# Patient Record
Sex: Male | Born: 1960 | Race: White | Hispanic: No | Marital: Married | Smoking: Never smoker
Health system: Southern US, Community
[De-identification: ages and names within clinical notes are randomized; demographics above are authoritative.]

## PROBLEM LIST (undated history)

## (undated) DIAGNOSIS — G8929 Other chronic pain: Secondary | ICD-10-CM

## (undated) DIAGNOSIS — I10 Essential (primary) hypertension: Secondary | ICD-10-CM

## (undated) DIAGNOSIS — E785 Hyperlipidemia, unspecified: Secondary | ICD-10-CM

## (undated) DIAGNOSIS — M869 Osteomyelitis, unspecified: Secondary | ICD-10-CM

## (undated) DIAGNOSIS — E039 Hypothyroidism, unspecified: Secondary | ICD-10-CM

## (undated) DIAGNOSIS — M25519 Pain in unspecified shoulder: Secondary | ICD-10-CM

## (undated) HISTORY — DX: Hyperlipidemia, unspecified: E78.5

## (undated) HISTORY — DX: Hypothyroidism, unspecified: E03.9

## (undated) HISTORY — DX: Osteomyelitis, unspecified: M86.9

## (undated) HISTORY — DX: Other chronic pain: G89.29

## (undated) HISTORY — DX: Essential (primary) hypertension: I10

## (undated) HISTORY — DX: Pain in unspecified shoulder: M25.519

---
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
## 1989-01-29 HISTORY — PX: COLOSTOMY TAKEDOWN: SHX5783

## 1989-01-29 HISTORY — PX: COLON RESECTION: SHX5231

## 1989-01-29 HISTORY — PX: COLOSTOMY: SHX63

## 1992-01-30 LAB — HM COLONOSCOPY

## 2010-08-29 ENCOUNTER — Encounter: Payer: Self-pay | Admitting: Family Medicine

## 2010-08-29 DIAGNOSIS — E039 Hypothyroidism, unspecified: Secondary | ICD-10-CM | POA: Insufficient documentation

## 2010-08-29 DIAGNOSIS — E785 Hyperlipidemia, unspecified: Secondary | ICD-10-CM | POA: Insufficient documentation

## 2010-08-29 DIAGNOSIS — I10 Essential (primary) hypertension: Secondary | ICD-10-CM | POA: Insufficient documentation

## 2012-04-17 ENCOUNTER — Telehealth: Payer: Self-pay | Admitting: Family Medicine

## 2012-04-17 NOTE — Telephone Encounter (Signed)
Med has been discontinued

## 2012-04-17 NOTE — Telephone Encounter (Signed)
Pharmacy made aware that medication has been d/c'd

## 2012-04-17 NOTE — Telephone Encounter (Signed)
Need approval for controlled medication. 

## 2012-04-21 ENCOUNTER — Telehealth: Payer: Self-pay | Admitting: Family Medicine

## 2012-04-21 NOTE — Telephone Encounter (Signed)
Med was dc'd at last ov/ no refill

## 2012-04-23 ENCOUNTER — Telehealth: Payer: Self-pay | Admitting: Family Medicine

## 2012-04-23 NOTE — Telephone Encounter (Signed)
Fax back paper for denial of medication was dc'd in 12/13 ov

## 2012-04-28 ENCOUNTER — Telehealth: Payer: Self-pay | Admitting: Family Medicine

## 2012-04-28 MED ORDER — CLONAZEPAM 0.5 MG PO TABS: 0.5000 mg | ORAL_TABLET | Freq: Every evening | ORAL | Status: DC | PRN

## 2012-04-28 NOTE — Telephone Encounter (Signed)
Ok to call out 30 with 2 refills.

## 2012-04-28 NOTE — Telephone Encounter (Signed)
rx called out

## 2012-04-28 NOTE — Telephone Encounter (Signed)
Ok to refill 

## 2012-05-04 ENCOUNTER — Other Ambulatory Visit: Payer: Self-pay | Admitting: Family Medicine

## 2012-05-06 ENCOUNTER — Other Ambulatory Visit: Payer: Self-pay | Admitting: Family Medicine

## 2012-06-09 ENCOUNTER — Telehealth: Payer: Self-pay | Admitting: Family Medicine

## 2012-06-09 MED ORDER — SIMVASTATIN 40 MG PO TABS: 40.0000 mg | ORAL_TABLET | Freq: Every day | ORAL | Status: DC

## 2012-06-09 NOTE — Telephone Encounter (Signed)
rx refilled.

## 2012-07-06 ENCOUNTER — Other Ambulatory Visit: Payer: Self-pay | Admitting: Family Medicine

## 2012-07-27 ENCOUNTER — Other Ambulatory Visit: Payer: Self-pay | Admitting: Family Medicine

## 2012-07-29 ENCOUNTER — Other Ambulatory Visit: Payer: Self-pay | Admitting: Family Medicine

## 2012-07-29 NOTE — Telephone Encounter (Signed)
?   OK to Refill  

## 2012-07-29 NOTE — Telephone Encounter (Signed)
Med refilled.

## 2012-07-29 NOTE — Telephone Encounter (Signed)
He has had no Ov since on Epic. Last refill was in March. Please pull paper chart and review to see if seems appropriate for refill and see if due for OV.

## 2012-07-31 NOTE — Telephone Encounter (Signed)
Med refijlled

## 2012-07-31 NOTE — Telephone Encounter (Signed)
LOV was bp check in 08/28/11. His last refill was March b/c Dr. Tanya Nones give him 2 additional refills.. Can you refill x 1 month and will send out letter to schedule ov.

## 2012-07-31 NOTE — Telephone Encounter (Signed)
i had already looked at his chart before I saw sandys note. He had a OV 04/11/12.  He can have # 60 / 2 additional refills.

## 2012-08-28 ENCOUNTER — Telehealth: Payer: Self-pay | Admitting: Family Medicine

## 2012-08-28 MED ORDER — ALLOPURINOL 100 MG PO TABS: 200.0000 mg | ORAL_TABLET | Freq: Every day | ORAL | Status: DC

## 2012-08-28 MED ORDER — PREDNISONE 20 MG PO TABS: ORAL_TABLET | ORAL | Status: DC

## 2012-08-28 NOTE — Telephone Encounter (Signed)
You don't want to start allopurinol in a gout flare because it will make it worse.  Call out prednisone taper pack.  Resume allopurinol 2 weeks after flare has subsided.

## 2012-08-28 NOTE — Telephone Encounter (Signed)
..  Patient aware and meds sent to pharm

## 2012-09-07 ENCOUNTER — Other Ambulatory Visit: Payer: Self-pay | Admitting: Family Medicine

## 2012-09-10 ENCOUNTER — Other Ambulatory Visit: Payer: Self-pay | Admitting: Family Medicine

## 2012-10-31 ENCOUNTER — Other Ambulatory Visit: Payer: Self-pay | Admitting: Family Medicine

## 2012-11-04 ENCOUNTER — Other Ambulatory Visit: Payer: Self-pay | Admitting: Family Medicine

## 2012-11-05 ENCOUNTER — Other Ambulatory Visit: Payer: Self-pay | Admitting: Family Medicine

## 2012-11-09 ENCOUNTER — Other Ambulatory Visit: Payer: Self-pay | Admitting: Family Medicine

## 2012-11-10 NOTE — Telephone Encounter (Signed)
ok 

## 2012-11-10 NOTE — Telephone Encounter (Signed)
?   OK to Refill  

## 2013-01-17 ENCOUNTER — Other Ambulatory Visit: Payer: Self-pay | Admitting: Family Medicine

## 2013-01-19 ENCOUNTER — Other Ambulatory Visit: Payer: Self-pay | Admitting: Family Medicine

## 2013-01-29 HISTORY — PX: SOFT TISSUE BIOPSY: SHX1053

## 2013-02-05 ENCOUNTER — Encounter: Payer: Self-pay | Admitting: Physician Assistant

## 2013-02-05 ENCOUNTER — Ambulatory Visit (INDEPENDENT_AMBULATORY_CARE_PROVIDER_SITE_OTHER): Admitting: Physician Assistant

## 2013-02-05 VITALS — BP 162/108 | HR 68 | Temp 98.1°F | Resp 20 | Ht 71.0 in | Wt 240.0 lb

## 2013-02-05 DIAGNOSIS — M5380 Other specified dorsopathies, site unspecified: Secondary | ICD-10-CM

## 2013-02-05 DIAGNOSIS — M62830 Muscle spasm of back: Secondary | ICD-10-CM

## 2013-02-05 MED ORDER — HYDROCODONE-ACETAMINOPHEN 5-325 MG PO TABS: 1.0000 | ORAL_TABLET | Freq: Four times a day (QID) | ORAL | Status: DC | PRN

## 2013-02-05 MED ORDER — METAXALONE 800 MG PO TABS: 800.0000 mg | ORAL_TABLET | Freq: Three times a day (TID) | ORAL | Status: DC

## 2013-02-05 NOTE — Progress Notes (Signed)
Patient ID: Daniel BeardsRobert Houston MRN: [id number], DOB: 11/23/1960, 53 y.o. Date of Encounter: 02/05/2013, 4:33 PM    Chief Complaint:  Chief Complaint  Patient presents with  . back pain    x 2 days  no known injury     HPI: 53 y.o. year old white male says " this isn't the first time this is has happened, but definitely the worst." Says he has had some minor muscle spasms in his back in the past but they always resolved without having to seek any medical evaluation. Says that yesterday he went to get a haircut, then came home. Says that he just moved the wrong way and suddenly this pain and spasm was there. Says he was doing no lifting or bending or any other exertion at that time. Points to the midline at the level of his pant waistline as the area of pain and spasm. Says the pain wears off when he lays down. Says that when he has to stand from a sitting position is the worst. As well, certain movements seem to make it spasm. He has had no pain in the buttock. No pain down either leg. No weakness numbness or tingling in either leg or foot. No incontinence of bowel or bladder.     Home Meds: See attached medication section for any medications that were entered at today's visit. The computer does not put those onto this list.The following list is a list of meds entered prior to today's visit.   Current Outpatient Prescriptions on File Prior to Visit  Medication Sig Dispense Refill  . allopurinol (ZYLOPRIM) 100 MG tablet TAKE 2 TABLETS (200 MG TOTAL) BY MOUTH DAILY.  60 tablet  1  . clonazePAM (KLONOPIN) 0.5 MG tablet TAKE 1 TABLET BY MOUTH AT BEDTIME  30 tablet  2  . fenofibrate 54 MG tablet TAKE 1 TABLET EVERY DAY  30 tablet  1  . levothyroxine (SYNTHROID, LEVOTHROID) 175 MCG tablet TAKE 1 TABLET BY MOUTH EVERY DAY  30 tablet  5  . losartan-hydrochlorothiazide (HYZAAR) 100-12.5 MG per tablet TAKE 1 TABLET EVERY DAY FOR BLOOD PRESSURE **NEED TO MAKE OFFICE APPT**  30 tablet  1  . simvastatin  (ZOCOR) 40 MG tablet TAKE 1 TABLET (40 MG TOTAL) BY MOUTH AT BEDTIME.  30 tablet  3  . ALPRAZolam (XANAX) 0.5 MG tablet Take 0.5 mg by mouth 3 (three) times daily as needed.        . diazepam (VALIUM) 5 MG tablet Take 5 mg by mouth at bedtime as needed.        . indomethacin (INDOCIN) 25 MG capsule Take 25 mg by mouth 2 (two) times daily with a meal.         No current facility-administered medications on file prior to visit.    Allergies:  Allergies  Allergen Reactions  . Fish Allergy   . Penicillins Rash      Review of Systems: See HPI for pertinent ROS. All other ROS negative.    Physical Exam: Blood pressure 152/88, pulse 68, temperature 98.1 F (36.7 C), temperature source Oral, resp. rate 20, height 5\' 11"  (1.803 m), weight 240 lb (108.863 kg)., Body mass index is 33.49 kg/(m^2). General:  WNWD WM. ANAD when sitting still. Has severe spasm/ pain when stands after sitting in chair. Did not have him get up on exam table, as he develops severe spasm/pain with movements. Neck: Supple. No thyromegaly. No lymphadenopathy. Lungs: Clear bilaterally to auscultation without wheezes, rales, or  rhonchi. Breathing is unlabored. Heart: Regular rhythm. No murmurs, rubs, or gallops. Msk:  Strength and tone normal for age.See Note above under "GENERAL" exam. Neuro: Alert and oriented X 3. Moves all extremities spontaneously.  CNII-XII grossly in tact.  Psych:  Responds to questions appropriately with a normal affect.     ASSESSMENT AND PLAN:  53 y.o. year old male with  1. Muscle spasm of back He works as a IT sales professional. He was supposed to be at work today but had to call in. I am giving him a note to cover him for being out of work today. He says that he is already scheduled to be off for the next 4 days.  I cautioned him that these medications can cause drowsiness and "loopiness" and not to take these medications prior to driving or operating any type of vehicle/machinery. I discussed using  heat to help relax the muscles. He is to use the Skelaxin regularly 3-4 times a day until the spasm decreases. Can use the Norco as needed. Follow up if he has no improvement in 48 hours. Followup if continues to have any symptoms in one week. Discussed indications to return for x-ray and further evaluation. - metaxalone (SKELAXIN) 800 MG tablet; Take 1 tablet (800 mg total) by mouth 3 (three) times daily.  Dispense: 30 tablet; Refill: 1 - HYDROcodone-acetaminophen (NORCO/VICODIN) 5-325 MG per tablet; Take 1 tablet by mouth every 6 (six) hours as needed.  Dispense: 40 tablet; Refill: 0   Signed, [address] [place], [place], [facility] 02/05/2013 4:33 PM

## 2013-02-14 ENCOUNTER — Other Ambulatory Visit: Payer: Self-pay | Admitting: Family Medicine

## 2013-02-19 ENCOUNTER — Ambulatory Visit
Admission: RE | Admit: 2013-02-19 | Discharge: 2013-02-19 | Disposition: A | Discharge status: Home / Self Care | Source: Ambulatory Visit | Attending: Physician Assistant | Admitting: Physician Assistant

## 2013-02-19 ENCOUNTER — Ambulatory Visit (INDEPENDENT_AMBULATORY_CARE_PROVIDER_SITE_OTHER): Admitting: Physician Assistant

## 2013-02-19 ENCOUNTER — Encounter: Payer: Self-pay | Admitting: Physician Assistant

## 2013-02-19 VITALS — BP 150/104 | HR 68 | Temp 98.0°F | Resp 18 | Wt 233.0 lb

## 2013-02-19 DIAGNOSIS — M545 Low back pain, unspecified: Secondary | ICD-10-CM

## 2013-02-19 NOTE — Progress Notes (Signed)
Patient ID: Marguerita BeardsRobert Shimamoto MRN: [id number], DOB: 09/24/1960, 53 y.o. Date of Encounter: 02/19/2013, 9:41 AM    Chief Complaint:  Chief Complaint  Patient presents with  . continues to have back issues     HPI: 53 y.o. year old white male is here with recurrent problem with back pain.  He saw me for an office visit regarding back pain on 02/05/13. At that time he reported that "that wasn't the first time this has happened but definitely the worst." Said that he had experienced some minor muscle spasms in his back in the past but they always resolved without having to seek any medical evaluation. Reported that the day prior to that office visit he went to get a haircut, and went home. Says that he just moved the wrong way and suddenly the pain and spasm was there. [place] SpanielSaid that he was doing no lifting or bending or any other exertion at that time. At that visit he pointed to the midline at the level of his pants waist line as the area of pain and spasm. He reported that the pain would wear off when he was lying down. When he would stand up after being in a sitting position was when he would experience the worst pain. As well certain movements seemed to make it spasm. At that visit he was having no pain in the buttock. No pain down either leg. No weakness numbness or tingling in either leg or foot. No incontinence of bowel or bladder.  He works as a IT sales professionalfirefighter. At the time of that visit 02/05/13 he was supposed to have been at work that day but had called in. I gave him a note to be out of work that day. He was already scheduled to be off for the next 4 days. At that visit prescribed Skelaxin 800 mg 1 by mouth 3 times a day and also Norco 5/325 one by mouth every 6 hour when necessary.  Today he reports that when he went home from that visit on 02/05/13, he took both the Skelaxin and the Norco for one day. Also used icy hot. Says that the next day his back was better and he did not have to take any  further medication. He then went back to work this past Friday and Sunday. On Monday morning which was 02/16/13, he had the spasm again. When the spasm occurred he was doing no activity. Says it through today Monday and progressively worsened. On Tuesday it was better. On Wednesday, 02/18/13, " could not get out of bed". As the exact same symptoms that he had at the last visit. Spasm in his low back midline at the level of his waist line. No pain in either buttock. No pain numbness or tingling down either leg. No weakness in either leg or foot. No incontinence of bowel or bladder and no cauda equinus symptoms.     Home Meds: See attached medication section for any medications that were entered at today's visit. The computer does not put those onto this list.The following list is a list of meds entered prior to today's visit.   Current Outpatient Prescriptions on File Prior to Visit  Medication Sig Dispense Refill  . allopurinol (ZYLOPRIM) 100 MG tablet TAKE 2 TABLETS (200 MG TOTAL) BY MOUTH DAILY.  60 tablet  1  . ALPRAZolam (XANAX) 0.5 MG tablet Take 0.5 mg by mouth 3 (three) times daily as needed.        . clonazePAM (KLONOPIN) 0.5  MG tablet TAKE 1 TABLET BY MOUTH AT BEDTIME  30 tablet  2  . diazepam (VALIUM) 5 MG tablet Take 5 mg by mouth at bedtime as needed.        . fenofibrate 54 MG tablet TAKE 1 TABLET EVERY DAY  30 tablet  1  . HYDROcodone-acetaminophen (NORCO/VICODIN) 5-325 MG per tablet Take 1 tablet by mouth every 6 (six) hours as needed.  40 tablet  0  . indomethacin (INDOCIN) 25 MG capsule Take 25 mg by mouth 2 (two) times daily with a meal.        . levothyroxine (SYNTHROID, LEVOTHROID) 175 MCG tablet TAKE 1 TABLET BY MOUTH EVERY DAY  30 tablet  5  . losartan-hydrochlorothiazide (HYZAAR) 100-12.5 MG per tablet TAKE 1 TABLET EVERY DAY FOR BLOOD PRESSURE **NEED TO MAKE OFFICE APPT**  30 tablet  1  . metaxalone (SKELAXIN) 800 MG tablet Take 1 tablet (800 mg total) by mouth 3 (three)  times daily.  30 tablet  1  . simvastatin (ZOCOR) 40 MG tablet TAKE 1 TABLET (40 MG TOTAL) BY MOUTH AT BEDTIME.  30 tablet  3   No current facility-administered medications on file prior to visit.    Allergies:  Allergies  Allergen Reactions  . Fish Allergy   . Penicillins Rash      Review of Systems: See HPI for pertinent ROS. All other ROS negative.    Physical Exam: Blood pressure 150/104, pulse 68, temperature 98 F (36.7 C), temperature source Oral, resp. rate 18, weight 233 lb (105.688 kg)., Body mass index is 32.51 kg/(m^2). General:  WNWD WM. Appears in no acute distress. Lungs: Clear bilaterally to auscultation without wheezes, rales, or rhonchi. Breathing is unlabored. Heart: Regular rhythm. No murmurs, rubs, or gallops. Msk:  Strength and tone normal for age. Again today, I did not have him get up on the exam table because every time he even stands after sitting in the chair, standing movement causes severe spasm in his back at midline at the level of his pant line. This area is tender with palpation. No other areas tender with palpation. Extremities/Skin: Warm and dry. Neuro: Alert and oriented X 3. Moves all extremities spontaneously. Gait is normal. CNII-XII grossly in tact. Psych:  Responds to questions appropriately with a normal affect.     ASSESSMENT AND PLAN:  53 y.o. year old male with  1. Low back pain - DG Lumbar Spine Complete; Future  Obtain x-ray now. Discussed that he may ultimately need an MRI but we need to start with x-ray. He is to go home and rest the back. I told him to take the Skelaxin 3 times a day every day for the next 2 weeks. Even if he is not having symptoms of spasm and pain I think he should continue to take this muscle relaxer to prevent recurrent spasm.( He says it only caused him minor drowsiness) Use the Norco only as needed for severe pain. I will followup with him when we obtain the x-ray result. He plans to go directly to get  this x-ray right now.  Today he is scheduled to be off work. He is already scheduled to be off again tomorrow. He is scheduled to work Saturday 02/21/13 and Monday 02/23/13. He is then scheduled to be off on Tuesday and Wednesday 1/27 and 1/28. I have written him a note to be out of work through Wednesday 1/28. He is going to go ahead and schedule a followup office visit here with  me on this upcoming Monday 02/23/13.  [address] [place], [place], [facility] 02/19/2013 9:41 AM

## 2013-02-23 ENCOUNTER — Ambulatory Visit (INDEPENDENT_AMBULATORY_CARE_PROVIDER_SITE_OTHER): Admitting: Physician Assistant

## 2013-02-23 ENCOUNTER — Encounter: Payer: Self-pay | Admitting: Physician Assistant

## 2013-02-23 VITALS — BP 136/92 | HR 68 | Resp 18

## 2013-02-23 DIAGNOSIS — M5380 Other specified dorsopathies, site unspecified: Secondary | ICD-10-CM

## 2013-02-23 DIAGNOSIS — M62830 Muscle spasm of back: Secondary | ICD-10-CM

## 2013-02-23 DIAGNOSIS — M545 Low back pain, unspecified: Secondary | ICD-10-CM

## 2013-02-23 MED ORDER — METAXALONE 800 MG PO TABS: 800.0000 mg | ORAL_TABLET | Freq: Three times a day (TID) | ORAL | Status: DC

## 2013-02-24 NOTE — Progress Notes (Signed)
Patient ID: Daniel Houston MRN: [id number], DOB: 06-19-1960, 53 y.o. Date of Encounter: 02/24/2013, 12:40 PM    Chief Complaint:  Chief Complaint  Patient presents with  . follow up back pain    feeling much better     HPI: 53 y.o. year old white male is here for followup of his recent back pain.  He saw me for an office visit regarding back pain on 02/05/13. At That time he reported that "that wasn't the first time this has happened but definitely the worst."  Said that he had experienced some minor muscle spasms in his back in the past but they always resolved without having to seek any medical evaluation. Reported that the day prior to that office visit he went to get a haircut, and went home. Says that he just moved the wrong way and suddenly the pain and spasm was there. He was doing no lifting bending or other exertion at that time. At the visit he pointed to the midline at the level of his pants waist line as the area of pain and spasm. He reported the pain  Would resolve when he was lying down. When he would stand up after being in a sitting position was when the pain would be the worst. As well certain movements seem to make it spasm. He was having no pain in the Buttock. No pain down either leg. No other symptoms. He works as a IT sales professional. At the time of that visit he was supposed to have been at work that day but had called in. I gave him a note to be out of work that day and he was already scheduled to be off the following 4 days. I prescribed Skelaxin 800 mg and also Norco 5/325.  He came for followup office visit on 02/19/13. He reported that when he went home from the visit on 02/05/13 he took the Skelaxin and Norco for one day. Also used icy hot. Stated that the next day his back was better and he did not have to take any further medication. He went back to work Friday and Sunday. On Monday morning which was 02/16/13 he had a spasm again. When the spasm occurred he was doing no  activity. On Wednesday 02/18/13 he "could not get out of bed." He was experiencing the exact same symptoms that he experienced at the prior visit. Spasm in the low back midline at the level of the waist line. No other associated pain numbness tingling or weakness.  At that time we obtained lumbar spine x-ray. This revealed mild to moderate degenerative changes at L2-3, L4-5. Otherwise normal. At that visit I told him to take the Skelaxin 3 times a day every day for the following 2 weeks. Even if he was not having symptoms of spasm and pain I felt that he should continue to muscle relaxants  to prevent recurrent spasm. (He reported that it caused no drowsiness). He was to use Norco only as needed for severe pain. He was written to be out of work through Wednesday 1/28. He was to rest the back during that entire period and followup with me Monday 1/26.  His last visit with me was 02/19/13 which was a Thursday. He says that Saturday he was much better. On Sunday he was having no problems. He notices if he sits still for 5 or 10 minutes he felt a little bit of discomfort there but gets up and walks it resolves. Otherwise completely asymptomatic.  Home Meds: See attached medication section for any medications that were entered at today's visit. The computer does not put those onto this list.The following list is a list of meds entered prior to today's visit.   Current Outpatient Prescriptions on File Prior to Visit  Medication Sig Dispense Refill  . allopurinol (ZYLOPRIM) 100 MG tablet TAKE 2 TABLETS (200 MG TOTAL) BY MOUTH DAILY.  60 tablet  1  . ALPRAZolam (XANAX) 0.5 MG tablet Take 0.5 mg by mouth 3 (three) times daily as needed.        . clonazePAM (KLONOPIN) 0.5 MG tablet TAKE 1 TABLET BY MOUTH AT BEDTIME  30 tablet  2  . diazepam (VALIUM) 5 MG tablet Take 5 mg by mouth at bedtime as needed.        . fenofibrate 54 MG tablet TAKE 1 TABLET EVERY DAY  30 tablet  1  . HYDROcodone-acetaminophen  (NORCO/VICODIN) 5-325 MG per tablet Take 1 tablet by mouth every 6 (six) hours as needed.  40 tablet  0  . indomethacin (INDOCIN) 25 MG capsule Take 25 mg by mouth 2 (two) times daily with a meal.        . levothyroxine (SYNTHROID, LEVOTHROID) 175 MCG tablet TAKE 1 TABLET BY MOUTH EVERY DAY  30 tablet  5  . losartan-hydrochlorothiazide (HYZAAR) 100-12.5 MG per tablet TAKE 1 TABLET EVERY DAY FOR BLOOD PRESSURE **NEED TO MAKE OFFICE APPT**  30 tablet  1  . simvastatin (ZOCOR) 40 MG tablet TAKE 1 TABLET (40 MG TOTAL) BY MOUTH AT BEDTIME.  30 tablet  3   No current facility-administered medications on file prior to visit.    Allergies:  Allergies  Allergen Reactions  . Fish Allergy   . Penicillins Rash      Review of Systems: See HPI for pertinent ROS. All other ROS negative.    Physical Exam: Blood pressure 136/92, pulse 68, resp. rate 18., There is no weight on file to calculate BMI. General:  WNWD WM. Appears in no acute distress. Neck: Supple. No thyromegaly. No lymphadenopathy. Lungs: Clear bilaterally to auscultation without wheezes, rales, or rhonchi. Breathing is unlabored. Heart: Regular rhythm. No murmurs, rubs, or gallops. Msk:  Strength and tone normal for age. Extremities/Skin: Warm and dry. Neuro: Alert and oriented X 3. Moves all extremities spontaneously. Gait is normal. CNII-XII grossly in tact. Psych:  Responds to questions appropriately with a normal affect.     ASSESSMENT AND PLAN:  53 y.o. year old male with  1. Low back pain  2. Muscle spasm of back - metaxalone (SKELAXIN) 800 MG tablet; Take 1 tablet (800 mg total) by mouth 3 (three) times daily.  Dispense: 90 tablet; Refill: 1  He is currently symptom-free. Discussed continuing the Skelaxin to prevent recurrent spasm. Also discussed continuing to rest the back whenever possible to also evaluate further spasm. In the future, once he is spasm free for several weeks, then he needs to add some slow stretches  and eventually he needs to do some exercises to strengthen his back and abdominal muscles.  Return for followup if he has recurrent symptoms in the future.  [address]igned, [place]ull [place], [place], [facility] 02/24/2013 12:40 PM

## 2013-02-25 ENCOUNTER — Other Ambulatory Visit: Payer: Self-pay | Admitting: Family Medicine

## 2013-02-28 ENCOUNTER — Other Ambulatory Visit: Payer: Self-pay | Admitting: Family Medicine

## 2013-04-21 ENCOUNTER — Encounter: Payer: Self-pay | Admitting: Family Medicine

## 2013-04-21 ENCOUNTER — Other Ambulatory Visit: Payer: Self-pay | Admitting: Family Medicine

## 2013-04-21 DIAGNOSIS — I10 Essential (primary) hypertension: Secondary | ICD-10-CM

## 2013-04-21 NOTE — Telephone Encounter (Signed)
Medication refill for one time only.  Patient needs to be seen.  Letter sent for patient to call and schedule 

## 2013-05-28 ENCOUNTER — Other Ambulatory Visit: Payer: Self-pay | Admitting: Family Medicine

## 2013-05-28 NOTE — Telephone Encounter (Signed)
Refill appropriate and filled per protocol. 

## 2013-06-14 ENCOUNTER — Other Ambulatory Visit: Payer: Self-pay | Admitting: Physician Assistant

## 2013-06-14 ENCOUNTER — Other Ambulatory Visit: Payer: Self-pay | Admitting: Family Medicine

## 2013-06-15 ENCOUNTER — Encounter: Payer: Self-pay | Admitting: Family Medicine

## 2013-06-15 NOTE — Telephone Encounter (Signed)
Medication refill for one time only.  Patient needs to be seen.  Letter sent for patient to call and schedule 

## 2013-06-15 NOTE — Telephone Encounter (Signed)
?   OK to Refill  

## 2013-06-15 NOTE — Telephone Encounter (Signed)
ok 

## 2013-07-01 ENCOUNTER — Other Ambulatory Visit: Payer: Self-pay | Admitting: Physician Assistant

## 2013-07-02 NOTE — Telephone Encounter (Signed)
Prescription sent to pharmacy.

## 2013-07-17 ENCOUNTER — Other Ambulatory Visit: Payer: Self-pay | Admitting: Physician Assistant

## 2013-07-17 ENCOUNTER — Other Ambulatory Visit: Payer: Self-pay | Admitting: Family Medicine

## 2013-07-17 NOTE — Telephone Encounter (Signed)
Ok to refill??  Last office visit/ refill 02/23/2013. 

## 2013-07-17 NOTE — Telephone Encounter (Signed)
Give 1 refill

## 2013-09-16 ENCOUNTER — Other Ambulatory Visit: Payer: Self-pay | Admitting: Family Medicine

## 2013-09-16 NOTE — Telephone Encounter (Signed)
Medication filled x1 with no refills.   Requires office visit before any further refills can be given.   Letter sent.  

## 2013-11-18 ENCOUNTER — Ambulatory Visit (INDEPENDENT_AMBULATORY_CARE_PROVIDER_SITE_OTHER): Admitting: Physician Assistant

## 2013-11-18 ENCOUNTER — Encounter: Payer: Self-pay | Admitting: Physician Assistant

## 2013-11-18 VITALS — BP 122/76 | HR 64 | Temp 98.1°F | Resp 18 | Wt 226.0 lb

## 2013-11-18 DIAGNOSIS — Z125 Encounter for screening for malignant neoplasm of prostate: Secondary | ICD-10-CM

## 2013-11-18 DIAGNOSIS — Z1211 Encounter for screening for malignant neoplasm of colon: Secondary | ICD-10-CM

## 2013-11-18 DIAGNOSIS — I10 Essential (primary) hypertension: Secondary | ICD-10-CM

## 2013-11-18 DIAGNOSIS — Z23 Encounter for immunization: Secondary | ICD-10-CM

## 2013-11-18 DIAGNOSIS — E039 Hypothyroidism, unspecified: Secondary | ICD-10-CM

## 2013-11-18 DIAGNOSIS — E785 Hyperlipidemia, unspecified: Secondary | ICD-10-CM

## 2013-11-18 LAB — COMPLETE METABOLIC PANEL WITH GFR
ALK PHOS: 43 U/L (ref 39–117)
ALT: 13 U/L (ref 0–53)
AST: 16 U/L (ref 0–37)
Albumin: 4.4 g/dL (ref 3.5–5.2)
BILIRUBIN TOTAL: 0.8 mg/dL (ref 0.2–1.2)
BUN: 16 mg/dL (ref 6–23)
CO2: 25 mEq/L (ref 19–32)
Calcium: 9.3 mg/dL (ref 8.4–10.5)
Chloride: 106 mEq/L (ref 96–112)
Creat: 1.14 mg/dL (ref 0.50–1.35)
GFR, EST NON AFRICAN AMERICAN: 73 mL/min
GFR, Est African American: 84 mL/min
Glucose, Bld: 87 mg/dL (ref 70–99)
Potassium: 4.5 mEq/L (ref 3.5–5.3)
SODIUM: 139 meq/L (ref 135–145)
Total Protein: 6.7 g/dL (ref 6.0–8.3)

## 2013-11-18 LAB — TSH: TSH: 38.787 u[IU]/mL — AB (ref 0.350–4.500)
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
## 2013-11-19 ENCOUNTER — Telehealth: Payer: Self-pay | Admitting: *Deleted

## 2013-11-19 ENCOUNTER — Other Ambulatory Visit: Payer: Self-pay | Admitting: *Deleted

## 2013-11-19 DIAGNOSIS — E039 Hypothyroidism, unspecified: Secondary | ICD-10-CM

## 2013-11-19 LAB — PSA: PSA: 0.71 ng/mL (ref <=4.00)

## 2013-11-19 MED ORDER — FENOFIBRATE 54 MG PO TABS: 54.0000 mg | ORAL_TABLET | Freq: Every day | ORAL | Status: DC

## 2013-11-19 MED ORDER — ALLOPURINOL 100 MG PO TABS: 200.0000 mg | ORAL_TABLET | Freq: Every day | ORAL | Status: DC

## 2013-11-19 MED ORDER — SIMVASTATIN 40 MG PO TABS: 40.0000 mg | ORAL_TABLET | Freq: Every day | ORAL | Status: DC

## 2013-11-19 MED ORDER — INDOMETHACIN 25 MG PO CAPS: 25.0000 mg | ORAL_CAPSULE | Freq: Two times a day (BID) | ORAL | Status: DC

## 2013-11-19 MED ORDER — LEVOTHYROXINE SODIUM 175 MCG PO TABS: 175.0000 ug | ORAL_TABLET | Freq: Every day | ORAL | Status: DC

## 2013-11-19 MED ORDER — DIAZEPAM 5 MG PO TABS: 5.0000 mg | ORAL_TABLET | Freq: Every evening | ORAL | Status: DC | PRN

## 2013-11-19 MED ORDER — LOSARTAN POTASSIUM-HCTZ 100-12.5 MG PO TABS: 1.0000 | ORAL_TABLET | Freq: Every day | ORAL | Status: DC

## 2013-11-19 NOTE — Telephone Encounter (Signed)
Valium approved for # 30 + 1 additional refill.
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
## 2013-11-19 NOTE — Telephone Encounter (Signed)
Spoke to patient.  He HAS NOT been taking his Thyroid medication.  Told he need to take the 175 mcg thyroid pill every day.  TSH level not normal.  Provider does not want to adjust dose until we know if current dose correct.  Reinforced to take daily and return in 6 week for repeat TSH and will adjust med then if need.  Also wanted to know about his regular medications.  Needs refills.  Went over his medication list again and refilled all his regular medications.    He is still going to bring Lipid panel he had done at his workplace in for the provider.   Also want refill on Valium for PRN use sleep.  Stated does not use clonopin or xanax anymore, so they were removed form med list.  OK refill?

## 2013-11-19 NOTE — Telephone Encounter (Signed)
RX called in .

## 2013-11-19 NOTE — Telephone Encounter (Signed)
Message copied by Donne AnonPLUMMER, Raiana Pharris M on Thu Nov 19, 2013 11:38 AM ------      Message from: Allayne ButcherIXON, MARY      Created: Thu Nov 19, 2013  7:30 AM       The patient had not had a regular office visit or labs for over 1-1/2 years prior to his recent visit. However at his recent visit he said that he was still taking all medicines. Medication list includes thyroid medicine at 175 mcg. However his TSH is significantly  abnormal. Verify with patient that he has currently been taking thyroid medicine every day for the past 6 weeks and let me know. ------

## 2013-11-23 ENCOUNTER — Ambulatory Visit: Admitting: Physician Assistant

## 2014-01-04 ENCOUNTER — Encounter: Payer: Self-pay | Admitting: Physician Assistant

## 2014-01-11 ENCOUNTER — Telehealth: Payer: Self-pay | Admitting: Physician Assistant

## 2014-01-11 NOTE — Telephone Encounter (Signed)
Patient is calling to request an eob from certain visits and billing in general  Please call him at [phone]

## 2014-01-19 NOTE — Telephone Encounter (Signed)
I called and spoke with patient he requested EOB for his account and his son's [name] DOB 09/06/1992. He states he recently came in and paid 251.92 which was for DOS 11/18/13 for him and DOS 10/01/13 for his son. I have printed and mailed to patient at his current address that I verified with patient.

## 2014-03-15 ENCOUNTER — Other Ambulatory Visit: Payer: Self-pay | Admitting: Physician Assistant

## 2014-03-15 DIAGNOSIS — E039 Hypothyroidism, unspecified: Secondary | ICD-10-CM

## 2014-03-16 NOTE — Telephone Encounter (Signed)
I called the patient.  At last visit (11/18/13) he had not been taking thyroid on regular basis and lab was abnormal.  Told to resume previous dose DAILY and have TSH repeated in 6 weeks.  That was October, here it is February and is just now getting refill request.  I asked him if was taking his medication again and he said he was and was taking every day.  Questioned #30 + 1 Rx from October but he assured me he had been taking and had only about 3 left.  Refill was denied, told pt needs to try to  get in here next few days and have his blood drawn so provider can order correct dose.  He said he would.

## 2014-03-17 ENCOUNTER — Other Ambulatory Visit

## 2014-03-17 DIAGNOSIS — E039 Hypothyroidism, unspecified: Secondary | ICD-10-CM

## 2014-03-17 NOTE — Telephone Encounter (Signed)
Agree 

## 2014-03-17 NOTE — Telephone Encounter (Signed)
Pt came today for TSH.  Will check level tomorrow and then order Thyroid med.

## 2014-03-18 LAB — TSH: TSH: 7.198 u[IU]/mL — ABNORMAL HIGH (ref 0.350–4.500)

## 2014-03-18 MED ORDER — LEVOTHYROXINE SODIUM 200 MCG PO TABS: 200.0000 ug | ORAL_TABLET | Freq: Every day | ORAL | Status: DC

## 2014-03-18 NOTE — Telephone Encounter (Signed)
Called pt with lab results.  Aware of change in Thyroid medication.  Understands provider request for routine ov to address health maintenance.  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  Told him that was fine but if we are to refill med's and address needs, we need to see him and get results of labs.  Told needs 6 week follow up.  Have rescheduled 6 myth visit from end of April to early April.  Pt to come fasting.  He says he will try to get his CPE work labs and bring them with him to appt.

## 2014-03-18 NOTE — Telephone Encounter (Signed)
-----   Message from Dorena BodoMary B Dixon, PA-C sent at 03/18/2014  7:29 AM EST ----- (FYI--PRIOR TO PT APPT 10/2013, HE HAD HAD NO OV TO DISCUSS CHRONIC MEDICAL PROBLEMS IN > 1 1/2 YEARS.  AT LOV, HE SAID HE WOULD BRING IN COPY OF FLP DONE ELSEWHERE--NEVER BROUGHT IT IN---H/O NONCOMPLIANCE----- Patient's thyroid dose currently 175 g.  Increase to 200 g daily.  ONLY SEND RX FOR # 30 + 1. NO FURTHER REFILLS UNTIL HAS OV!!!!!! TELL HIM TO RETURN FOR OV IN 6 WEEKS----COME FASTING TO THAT APPT

## 2014-05-03 ENCOUNTER — Encounter: Payer: Self-pay | Admitting: Physician Assistant

## 2014-05-03 ENCOUNTER — Encounter: Payer: Self-pay | Admitting: Internal Medicine

## 2014-05-03 ENCOUNTER — Other Ambulatory Visit: Payer: Self-pay | Admitting: Physician Assistant

## 2014-05-03 ENCOUNTER — Ambulatory Visit (INDEPENDENT_AMBULATORY_CARE_PROVIDER_SITE_OTHER): Admitting: Physician Assistant

## 2014-05-03 VITALS — BP 132/96 | HR 64 | Temp 97.6°F | Resp 18 | Wt 237.0 lb

## 2014-05-03 DIAGNOSIS — Z1211 Encounter for screening for malignant neoplasm of colon: Secondary | ICD-10-CM | POA: Diagnosis not present

## 2014-05-03 DIAGNOSIS — R5383 Other fatigue: Secondary | ICD-10-CM | POA: Diagnosis not present

## 2014-05-03 DIAGNOSIS — G4700 Insomnia, unspecified: Secondary | ICD-10-CM | POA: Insufficient documentation

## 2014-05-03 DIAGNOSIS — E039 Hypothyroidism, unspecified: Secondary | ICD-10-CM | POA: Diagnosis not present

## 2014-05-03 DIAGNOSIS — M109 Gout, unspecified: Secondary | ICD-10-CM

## 2014-05-03 DIAGNOSIS — I10 Essential (primary) hypertension: Secondary | ICD-10-CM

## 2014-05-03 DIAGNOSIS — E785 Hyperlipidemia, unspecified: Secondary | ICD-10-CM | POA: Diagnosis not present

## 2014-05-03 LAB — CBC WITH DIFFERENTIAL/PLATELET
BASOS ABS: 0 10*3/uL (ref 0.0–0.1)
BASOS PCT: 0 % (ref 0–1)
Eosinophils Absolute: 0.2 10*3/uL (ref 0.0–0.7)
Eosinophils Relative: 3 % (ref 0–5)
HEMATOCRIT: 46.8 % (ref 39.0–52.0)
HEMOGLOBIN: 15.8 g/dL (ref 13.0–17.0)
Lymphocytes Relative: 25 % (ref 12–46)
Lymphs Abs: 1.6 10*3/uL (ref 0.7–4.0)
MCH: 30.4 pg (ref 26.0–34.0)
MCHC: 33.8 g/dL (ref 30.0–36.0)
MCV: 90 fL (ref 78.0–100.0)
MONOS PCT: 7 % (ref 3–12)
MPV: 9.4 fL (ref 8.6–12.4)
Monocytes Absolute: 0.4 10*3/uL (ref 0.1–1.0)
NEUTROS ABS: 4 10*3/uL (ref 1.7–7.7)
Neutrophils Relative %: 65 % (ref 43–77)
PLATELETS: 264 10*3/uL (ref 150–400)
RBC: 5.2 MIL/uL (ref 4.22–5.81)
RDW: 13.8 % (ref 11.5–15.5)
WBC: 6.2 10*3/uL (ref 4.0–10.5)

## 2014-05-03 LAB — LIPID PANEL
CHOLESTEROL: 146 mg/dL (ref 0–200)
HDL: 31 mg/dL — ABNORMAL LOW (ref >=40)
Total CHOL/HDL Ratio: 4.7 Ratio
Triglycerides: 415 mg/dL — ABNORMAL HIGH (ref <150)

## 2014-05-03 LAB — COMPLETE METABOLIC PANEL WITH GFR
ALT: 42 U/L (ref 0–53)
AST: 24 U/L (ref 0–37)
Albumin: 4.7 g/dL (ref 3.5–5.2)
Alkaline Phosphatase: 40 U/L (ref 39–117)
BUN: 18 mg/dL (ref 6–23)
CO2: 27 mEq/L (ref 19–32)
CREATININE: 1.09 mg/dL (ref 0.50–1.35)
Calcium: 10.1 mg/dL (ref 8.4–10.5)
Chloride: 100 mEq/L (ref 96–112)
GFR, EST AFRICAN AMERICAN: 89 mL/min
GFR, EST NON AFRICAN AMERICAN: 77 mL/min
GLUCOSE: 130 mg/dL — AB (ref 70–99)
Potassium: 4.7 mEq/L (ref 3.5–5.3)
Sodium: 140 mEq/L (ref 135–145)
Total Bilirubin: 0.7 mg/dL (ref 0.2–1.2)
Total Protein: 7.1 g/dL (ref 6.0–8.3)

## 2014-05-03 LAB — TSH: TSH: 0.811 u[IU]/mL (ref 0.350–4.500)

## 2014-05-03 LAB — URIC ACID: Uric Acid, Serum: 6.8 mg/dL (ref 4.0–7.8)

## 2014-05-03 MED ORDER — ZOLPIDEM TARTRATE 10 MG PO TABS: 10.0000 mg | ORAL_TABLET | Freq: Every evening | ORAL | Status: DC | PRN
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
## 2014-05-04 LAB — HEMOGLOBIN A1C
Hgb A1c MFr Bld: 5.8 % — ABNORMAL HIGH (ref <5.7)
Mean Plasma Glucose: 120 mg/dL — ABNORMAL HIGH (ref <117)

## 2014-05-06 ENCOUNTER — Other Ambulatory Visit: Payer: Self-pay | Admitting: Family Medicine

## 2014-05-06 ENCOUNTER — Telehealth: Payer: Self-pay | Admitting: Family Medicine

## 2014-05-06 DIAGNOSIS — Z79899 Other long term (current) drug therapy: Secondary | ICD-10-CM

## 2014-05-06 DIAGNOSIS — E039 Hypothyroidism, unspecified: Secondary | ICD-10-CM

## 2014-05-06 DIAGNOSIS — E785 Hyperlipidemia, unspecified: Secondary | ICD-10-CM

## 2014-05-06 MED ORDER — FENOFIBRATE 145 MG PO TABS: 145.0000 mg | ORAL_TABLET | Freq: Every day | ORAL | Status: DC

## 2014-05-06 MED ORDER — LEVOTHYROXINE SODIUM 200 MCG PO TABS: 200.0000 ug | ORAL_TABLET | Freq: Every day | ORAL | Status: DC

## 2014-05-06 NOTE — Telephone Encounter (Signed)
Trying to reach patient.  Rx's to pharmacy.  Follow up labs ordered.

## 2014-05-06 NOTE — Telephone Encounter (Signed)
-----   Message from Dorena BodoMary B Dixon, PA-C sent at 05/05/2014  8:01 AM EDT ----- Thyroid is now normal. Continue current dose. Kim--we had recently been adjusting thyroid dose-- he probably is going to need refills sent for current dose. A1c was added and was normal. Triglycerides are so high that LDL could not be calculated. We need to start a medicine to lower triglyceride so that we can at least see the LDL at next check. Tricor 145 mg 1 by mouth daily at bedtime #30 with 1 refill Place future order for FLP LFT 6 weeks. Remainder of labs normal.

## 2014-05-07 ENCOUNTER — Other Ambulatory Visit: Payer: Self-pay | Admitting: Physician Assistant

## 2014-05-07 DIAGNOSIS — R739 Hyperglycemia, unspecified: Secondary | ICD-10-CM | POA: Insufficient documentation

## 2014-05-07 NOTE — Telephone Encounter (Signed)
Pt called back.  Explained lab results to him.  Understands new medication and need for repeat labs in 6 weeks.  Thyroid med refilled for 6  Months.

## 2014-05-07 NOTE — Telephone Encounter (Signed)
Medication refilled per protocol. 

## 2014-05-15 ENCOUNTER — Other Ambulatory Visit: Payer: Self-pay | Admitting: Physician Assistant

## 2014-05-17 NOTE — Telephone Encounter (Signed)
Duplicate refill

## 2014-05-18 ENCOUNTER — Other Ambulatory Visit: Payer: Self-pay | Admitting: Physician Assistant

## 2014-05-18 NOTE — Telephone Encounter (Signed)
Medication refilled per protocol. 

## 2014-05-20 ENCOUNTER — Ambulatory Visit: Admitting: Physician Assistant

## 2014-06-17 ENCOUNTER — Ambulatory Visit (AMBULATORY_SURGERY_CENTER): Admitting: *Deleted

## 2014-06-17 VITALS — Ht 72.0 in | Wt 238.8 lb

## 2014-06-17 DIAGNOSIS — Z1211 Encounter for screening for malignant neoplasm of colon: Secondary | ICD-10-CM

## 2014-06-17 NOTE — Progress Notes (Signed)
No allergies to eggs or soy. No problems with anesthesia.  Pt given Emmi instructions for colonoscopy  No oxygen use  No diet drug use  

## 2014-07-01 ENCOUNTER — Ambulatory Visit (AMBULATORY_SURGERY_CENTER): Admitting: Internal Medicine

## 2014-07-01 ENCOUNTER — Encounter: Payer: Self-pay | Admitting: Internal Medicine

## 2014-07-01 VITALS — BP 134/88 | HR 65 | Temp 97.8°F | Resp 17 | Ht 72.0 in | Wt 238.0 lb

## 2014-07-01 DIAGNOSIS — Z1211 Encounter for screening for malignant neoplasm of colon: Secondary | ICD-10-CM

## 2014-07-01 MED ORDER — SODIUM CHLORIDE 0.9 % IV SOLN: 500.0000 mL | INTRAVENOUS | Status: DC

## 2014-07-01 NOTE — Progress Notes (Signed)
Transferred to recovery room. A/O x3, pleased with MAC.  VSS.  Report to Jill, RN. 

## 2014-07-01 NOTE — Patient Instructions (Addendum)
No polyps or cancer seen. You do have a condition called diverticulosis - common and not usually a problem. Please read the handout provided.  Next routine colonoscopy in 10 years - 2026  I appreciate the opportunity to care for you. Iva Booparl E. Gessner, MD, [facility]  Discharge papers given Diverticulosis handout given Resume regular medication Repeat Colonoscopy in 10 yearsYOU HAD AN ENDOSCOPIC PROCEDURE TODAY AT THE [facility]:   Refer to the procedure report that was given to you for any specific questions about what was found during the examination.  If the procedure report does not answer your questions, please call your gastroenterologist to clarify.  If you requested that your care partner not be given the details of your procedure findings, then the procedure report has been included in a sealed envelope for you to review at your convenience later.  YOU SHOULD EXPECT: Some feelings of bloating in the abdomen. Passage of more gas than usual.  Walking can help get rid of the air that was put into your GI tract during the procedure and reduce the bloating. If you had a lower endoscopy (such as a colonoscopy or flexible sigmoidoscopy) you may notice spotting of blood in your stool or on the toilet paper. If you underwent a bowel prep for your procedure, you may not have a normal bowel movement for a few days.  Please Note:  You might notice some irritation and congestion in your nose or some drainage.  This is from the oxygen used during your procedure.  There is no need for concern and it should clear up in a day or so.  SYMPTOMS TO REPORT IMMEDIATELY:   Following lower endoscopy (colonoscopy or flexible sigmoidoscopy):  Excessive amounts of blood in the stool  Significant tenderness or worsening of abdominal pains  Swelling of the abdomen that is new, acute  Fever of 100F or higher   Following upper endoscopy (EGD)  Vomiting of blood or coffee ground material  New  chest pain or pain under the shoulder blades  Painful or persistently difficult swallowing  New shortness of breath  Fever of 100F or higher  Black, tarry-looking stools  For urgent or emergent issues, a gastroenterologist can be reached at any hour by calling [phone].   DIET: Your first meal following the procedure should be a small meal and then it is ok to progress to your normal diet. Heavy or fried foods are harder to digest and may make you feel nauseous or bloated.  Likewise, meals heavy in dairy and vegetables can increase bloating.  Drink plenty of fluids but you should avoid alcoholic beverages for 24 hours.  ACTIVITY:  You should plan to take it easy for the rest of today and you should NOT DRIVE or use heavy machinery until tomorrow (because of the sedation medicines used during the test).    FOLLOW UP: Our staff will call the number listed on your records the next business day following your procedure to check on you and address any questions or concerns that you may have regarding the information given to you following your procedure. If we do not reach you, we will leave a message.  However, if you are feeling well and you are not experiencing any problems, there is no need to return our call.  We will assume that you have returned to your regular daily activities without incident.  If any biopsies were taken you will be contacted by phone or by letter within the next  1-3 weeks.  Please call us at [phone] if you have not heard about the biopsies in 3 weeks.    SIGNATURES/CONFIDENTIALITY: You and/or your care partner have signed paperwork which will be entered into your electronic medical record.  These signatures attest to the fact that that the information above on your After Visit Summary has been reviewed and is understood.  Full responsibility of the confidentiality of this discharge information lies with you and/or your care-partner.

## 2014-07-01 NOTE — Op Note (Signed)
[facility] [address] [place], [id number]   COLONOSCOPY PROCEDURE REPORT  PATIENT: Daniel LangoCates, Tamika M  MR#: [id number] BIRTHDATE: [birth year] , 53  yrs. old GENDER: male ENDOSCOPIST: Iva Booparl E Gessner, MD, [facility] PROCEDURE DATE:  07/01/2014 PROCEDURE:   Colonoscopy, screening First Screening Colonoscopy - Avg.  risk and is 50 yrs.  old or older Yes.  Prior Negative Screening - Now for repeat screening. N/A  History of Adenoma - Now for follow-up colonoscopy & has been > or = to 3 yrs.  N/A  Polyps removed today? No Recommend repeat exam, <10 yrs? No ASA CLASS:   Class II INDICATIONS:Screening for colonic neoplasia and Colorectal Neoplasm Risk Assessment for this procedure is average risk. MEDICATIONS: Propofol 200 mg IV and Monitored anesthesia care  DESCRIPTION OF PROCEDURE:   After the risks benefits and alternatives of the procedure were thoroughly explained, informed consent was obtained.  The digital rectal exam revealed no abnormalities of the rectum, revealed no prostatic nodules, and revealed the prostate was not enlarged.   The LB [id number] endoscope was introduced through the anus and advanced to the surgical anastomosis. No adverse events experienced.   The quality of the prep was excellent.  (MiraLax was used)  The instrument was then slowly withdrawn as the colon was fully examined. Estimated blood loss is zero unless otherwise noted in this procedure report.      COLON FINDINGS: There was evidence of a prior end-to-side ileocolonic surgical anastomosis in the right colon.   There was diverticulosis noted in the sigmoid colon.   The examination was otherwise normal.  Retroflexed views revealed no abnormalities. The time to cecum = 1.5 Withdrawal time = 5.4   The scope was withdrawn and the procedure completed. COMPLICATIONS: There were no immediate complications.  ENDOSCOPIC IMPRESSION: 1.   There was evidence of a prior ileocolonic  surgical anastomosis in the right colon 2.   Diverticulosis was noted in the sigmoid colon 3.   The examination was otherwise normal  RECOMMENDATIONS: Repeat Colonscopy in 10 years.  2026  eSigned:  Iva Booparl E Gessner, MD, [facility] 07/01/2014 8:43 AM   cc: The Patient and Frazier RichardsMary Beth Dixon, PA-C

## 2014-07-02 ENCOUNTER — Telehealth: Payer: Self-pay

## 2014-07-02 NOTE — Telephone Encounter (Signed)
  Follow up Call-  Call back number 07/01/2014  Post procedure Call Back phone  # [phone]  Permission to leave phone message Yes     Patient questions:  Do you have a fever, pain , or abdominal swelling? No. Pain Score  0 *  Have you tolerated food without any problems? Yes.    Have you been able to return to your normal activities? Yes.    Do you have any questions about your discharge instructions: Diet   No. Medications  No. Follow up visit  No.  Do you have questions or concerns about your Care? No.  Actions: * If pain score is 4 or above: No action needed, pain <4.  [place]  No problems or concerns. maw

## 2014-07-10 ENCOUNTER — Other Ambulatory Visit: Payer: Self-pay | Admitting: Physician Assistant

## 2014-07-12 NOTE — Telephone Encounter (Signed)
Medication filled x1 with no refills.   Requires office visit before any further refills can be given.   Letter sent.  

## 2014-07-28 ENCOUNTER — Encounter: Payer: Self-pay | Admitting: *Deleted

## 2014-07-28 ENCOUNTER — Telehealth: Payer: Self-pay | Admitting: *Deleted

## 2014-07-28 ENCOUNTER — Emergency Department (INDEPENDENT_AMBULATORY_CARE_PROVIDER_SITE_OTHER)
Admission: EM | Admit: 2014-07-28 | Discharge: 2014-07-28 | Disposition: A | Discharge status: Home / Self Care | Payer: Worker's Compensation | Source: Home / Self Care | Attending: Family Medicine | Admitting: Family Medicine

## 2014-07-28 DIAGNOSIS — R0602 Shortness of breath: Secondary | ICD-10-CM | POA: Diagnosis not present

## 2014-07-28 LAB — COMPLETE METABOLIC PANEL WITH GFR
ALBUMIN: 4.8 g/dL (ref 3.5–5.2)
ALT: 26 U/L (ref 0–53)
AST: 25 U/L (ref 0–37)
Alkaline Phosphatase: 34 U/L — ABNORMAL LOW (ref 39–117)
BUN: 21 mg/dL (ref 6–23)
CALCIUM: 10.1 mg/dL (ref 8.4–10.5)
CHLORIDE: 103 meq/L (ref 96–112)
CO2: 23 mEq/L (ref 19–32)
Creat: 1.45 mg/dL — ABNORMAL HIGH (ref 0.50–1.35)
GFR, Est African American: 63 mL/min
GFR, Est Non African American: 55 mL/min — ABNORMAL LOW
Glucose, Bld: 91 mg/dL (ref 70–99)
Potassium: 4.6 mEq/L (ref 3.5–5.3)
Sodium: 140 mEq/L (ref 135–145)
TOTAL PROTEIN: 7.2 g/dL (ref 6.0–8.3)
Total Bilirubin: 0.6 mg/dL (ref 0.2–1.2)

## 2014-07-28 LAB — TROPONIN I: Troponin I: 0.05 ng/mL (ref <0.06)

## 2014-07-28 LAB — POCT URINALYSIS DIP (MANUAL ENTRY)
Bilirubin, UA: NEGATIVE
Blood, UA: NEGATIVE
GLUCOSE UA: NEGATIVE
Ketones, POC UA: NEGATIVE
Leukocytes, UA: NEGATIVE
NITRITE UA: NEGATIVE
Protein Ur, POC: NEGATIVE
Spec Grav, UA: 1.02 (ref 1.005–1.03)
UROBILINOGEN UA: 0.2 (ref 0–1)
pH, UA: 5.5 (ref 5–8)

## 2014-07-28 LAB — CK: Total CK: 191 U/L (ref 7–232)

## 2014-07-28 NOTE — ED Notes (Signed)
Pt c/o SOB while exercising at work x 0800 today. SOB has resolved now. He reports O2 99%, BP 144/100. BP later was 122/92.

## 2014-07-28 NOTE — Discharge Instructions (Signed)
Rest today and avoid heat exposure.  Remain hydrated.  Avoid physical activity today.
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
## 2014-07-30 ENCOUNTER — Encounter: Payer: Self-pay | Admitting: Emergency Medicine

## 2014-07-30 ENCOUNTER — Emergency Department (INDEPENDENT_AMBULATORY_CARE_PROVIDER_SITE_OTHER)
Admission: EM | Admit: 2014-07-30 | Discharge: 2014-07-30 | Disposition: A | Discharge status: Home / Self Care | Payer: Worker's Compensation | Source: Home / Self Care | Attending: Family Medicine | Admitting: Family Medicine

## 2014-07-30 DIAGNOSIS — Z0000 Encounter for general adult medical examination without abnormal findings: Secondary | ICD-10-CM | POA: Diagnosis not present

## 2014-07-30 DIAGNOSIS — Z7689 Persons encountering health services in other specified circumstances: Secondary | ICD-10-CM
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
## 2014-07-30 NOTE — ED Notes (Signed)
Here for clearance to return to work after episode if heat exhaustion 2 days ago. States he is feeling fine.

## 2014-07-30 NOTE — Discharge Instructions (Signed)
Limit athletic activities for 48 hours.  Remain well hydrated when outdoors.

## 2014-07-31 LAB — COMPLETE METABOLIC PANEL WITH GFR
ALT: 32 U/L (ref 0–53)
AST: 28 U/L (ref 0–37)
Albumin: 4.4 g/dL (ref 3.5–5.2)
Alkaline Phosphatase: 37 U/L — ABNORMAL LOW (ref 39–117)
BUN: 19 mg/dL (ref 6–23)
CO2: 25 mEq/L (ref 19–32)
CREATININE: 1.24 mg/dL (ref 0.50–1.35)
Calcium: 9.2 mg/dL (ref 8.4–10.5)
Chloride: 102 mEq/L (ref 96–112)
GFR, EST NON AFRICAN AMERICAN: 66 mL/min
GFR, Est African American: 76 mL/min
Glucose, Bld: 56 mg/dL — ABNORMAL LOW (ref 70–99)
Potassium: 4.2 mEq/L (ref 3.5–5.3)
Sodium: 143 mEq/L (ref 135–145)
TOTAL PROTEIN: 6.5 g/dL (ref 6.0–8.3)
Total Bilirubin: 0.4 mg/dL (ref 0.2–1.2)

## 2014-08-16 ENCOUNTER — Other Ambulatory Visit: Payer: Self-pay | Admitting: Physician Assistant

## 2014-08-17 ENCOUNTER — Encounter: Payer: Self-pay | Admitting: Family Medicine

## 2014-08-17 NOTE — Telephone Encounter (Signed)
Medication refill for one time only.  Patient needs to be seen.  Letter sent for patient to call and schedule 

## 2014-09-15 ENCOUNTER — Ambulatory Visit (INDEPENDENT_AMBULATORY_CARE_PROVIDER_SITE_OTHER): Admitting: Physician Assistant

## 2014-09-15 ENCOUNTER — Encounter: Payer: Self-pay | Admitting: Physician Assistant

## 2014-09-15 VITALS — BP 126/80 | HR 68 | Temp 97.6°F | Resp 16 | Ht 72.0 in | Wt 240.0 lb

## 2014-09-15 DIAGNOSIS — I10 Essential (primary) hypertension: Secondary | ICD-10-CM

## 2014-09-15 DIAGNOSIS — E039 Hypothyroidism, unspecified: Secondary | ICD-10-CM | POA: Diagnosis not present

## 2014-09-15 DIAGNOSIS — G4700 Insomnia, unspecified: Secondary | ICD-10-CM | POA: Diagnosis not present

## 2014-09-15 DIAGNOSIS — R739 Hyperglycemia, unspecified: Secondary | ICD-10-CM

## 2014-09-15 DIAGNOSIS — E785 Hyperlipidemia, unspecified: Secondary | ICD-10-CM | POA: Diagnosis not present

## 2014-09-15 DIAGNOSIS — M109 Gout, unspecified: Secondary | ICD-10-CM | POA: Diagnosis not present

## 2014-09-15 LAB — COMPLETE METABOLIC PANEL WITH GFR
ALBUMIN: 4.1 g/dL (ref 3.6–5.1)
ALK PHOS: 28 U/L — AB (ref 40–115)
ALT: 35 U/L (ref 9–46)
AST: 26 U/L (ref 10–35)
BILIRUBIN TOTAL: 0.5 mg/dL (ref 0.2–1.2)
BUN: 14 mg/dL (ref 7–25)
CALCIUM: 9.5 mg/dL (ref 8.6–10.3)
CHLORIDE: 106 mmol/L (ref 98–110)
CO2: 22 mmol/L (ref 20–31)
Creat: 1.13 mg/dL (ref 0.70–1.33)
GFR, Est African American: 85 mL/min (ref >=60)
GFR, Est Non African American: 74 mL/min (ref >=60)
Glucose, Bld: 118 mg/dL — ABNORMAL HIGH (ref 70–99)
Potassium: 4.3 mmol/L (ref 3.5–5.3)
Sodium: 138 mmol/L (ref 135–146)
Total Protein: 6.5 g/dL (ref 6.1–8.1)

## 2014-09-15 LAB — LIPID PANEL
CHOLESTEROL: 150 mg/dL (ref 125–200)
HDL: 26 mg/dL — ABNORMAL LOW (ref >=40)
LDL Cholesterol: 76 mg/dL (ref <130)
Total CHOL/HDL Ratio: 5.8 Ratio — ABNORMAL HIGH (ref <=5.0)
Triglycerides: 240 mg/dL — ABNORMAL HIGH (ref <150)
VLDL: 48 mg/dL — AB (ref <30)

## 2014-09-15 LAB — TSH: TSH: 0.704 u[IU]/mL (ref 0.350–4.500)

## 2014-09-15 LAB — URIC ACID: Uric Acid, Serum: 5 mg/dL (ref 4.0–7.8)
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
## 2014-09-16 ENCOUNTER — Other Ambulatory Visit: Payer: Self-pay | Admitting: Physician Assistant

## 2014-09-16 NOTE — Telephone Encounter (Signed)
Refill appropriate and filled per protocol. 

## 2014-11-11 ENCOUNTER — Encounter: Payer: Self-pay | Admitting: Physician Assistant

## 2014-11-11 ENCOUNTER — Encounter: Payer: Self-pay | Admitting: Family Medicine

## 2014-11-11 ENCOUNTER — Ambulatory Visit (INDEPENDENT_AMBULATORY_CARE_PROVIDER_SITE_OTHER): Admitting: Family Medicine

## 2014-11-11 VITALS — BP 130/82 | HR 78 | Temp 98.3°F | Resp 16 | Ht 72.0 in | Wt 242.0 lb

## 2014-11-11 DIAGNOSIS — J208 Acute bronchitis due to other specified organisms: Secondary | ICD-10-CM

## 2014-11-11 MED ORDER — AZITHROMYCIN 250 MG PO TABS: ORAL_TABLET | ORAL | Status: DC

## 2014-11-11 MED ORDER — HYDROCODONE-HOMATROPINE 5-1.5 MG/5ML PO SYRP: 5.0000 mL | ORAL_SOLUTION | Freq: Three times a day (TID) | ORAL | Status: DC | PRN
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
## 2014-12-10 ENCOUNTER — Other Ambulatory Visit: Payer: Self-pay | Admitting: Physician Assistant

## 2014-12-16 ENCOUNTER — Other Ambulatory Visit: Payer: Self-pay | Admitting: Physician Assistant

## 2014-12-16 NOTE — Telephone Encounter (Signed)
Refill appropriate and filled per protocol. 

## 2015-05-05 IMAGING — CR DG LUMBAR SPINE COMPLETE 4+V
5 series · 5 of 5 positions shown · non-contrast
Comparison: None.

CLINICAL DATA: Low back pain x1 month

EXAM:
LUMBAR SPINE - COMPLETE 4+ VIEW

[view not recorded (1 of 5)]
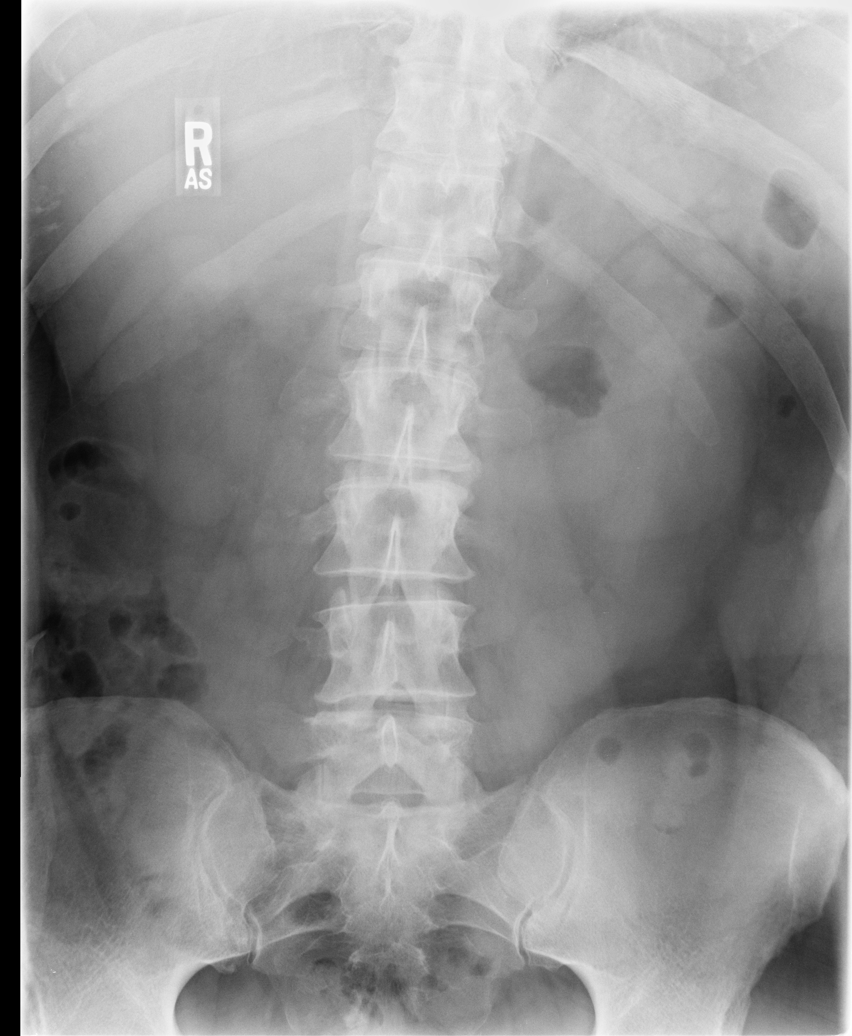

[view not recorded (2 of 5)]
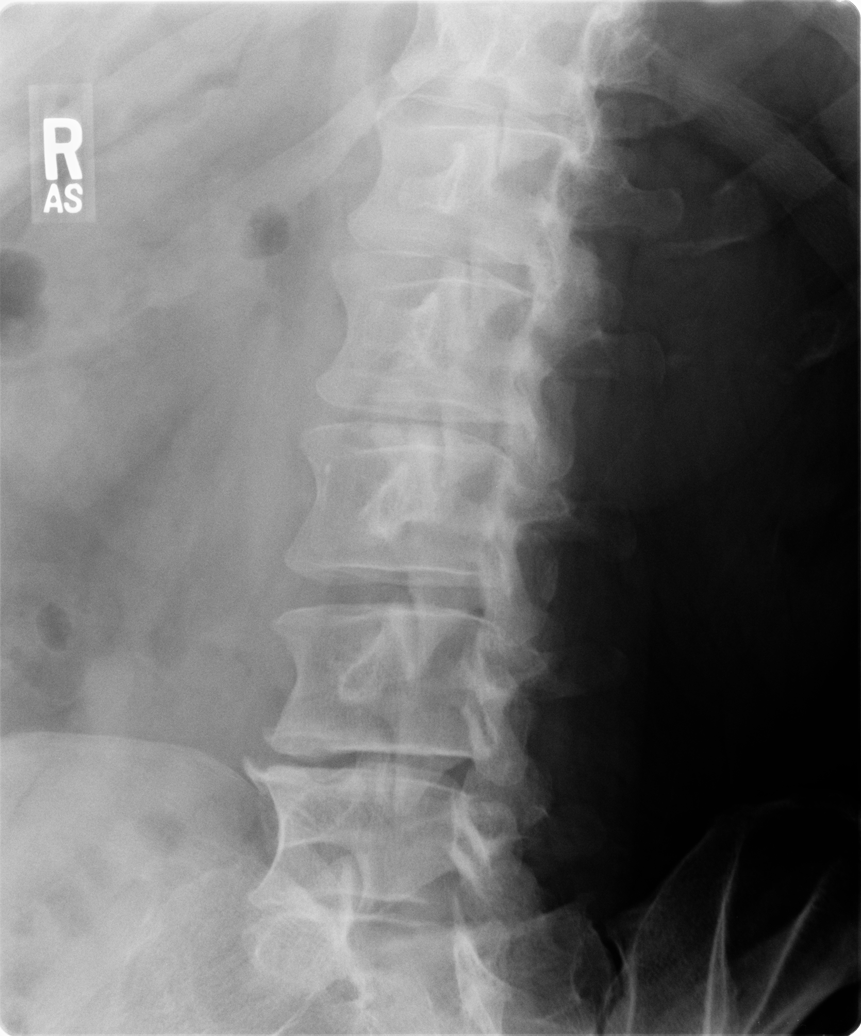

[view not recorded (3 of 5)]
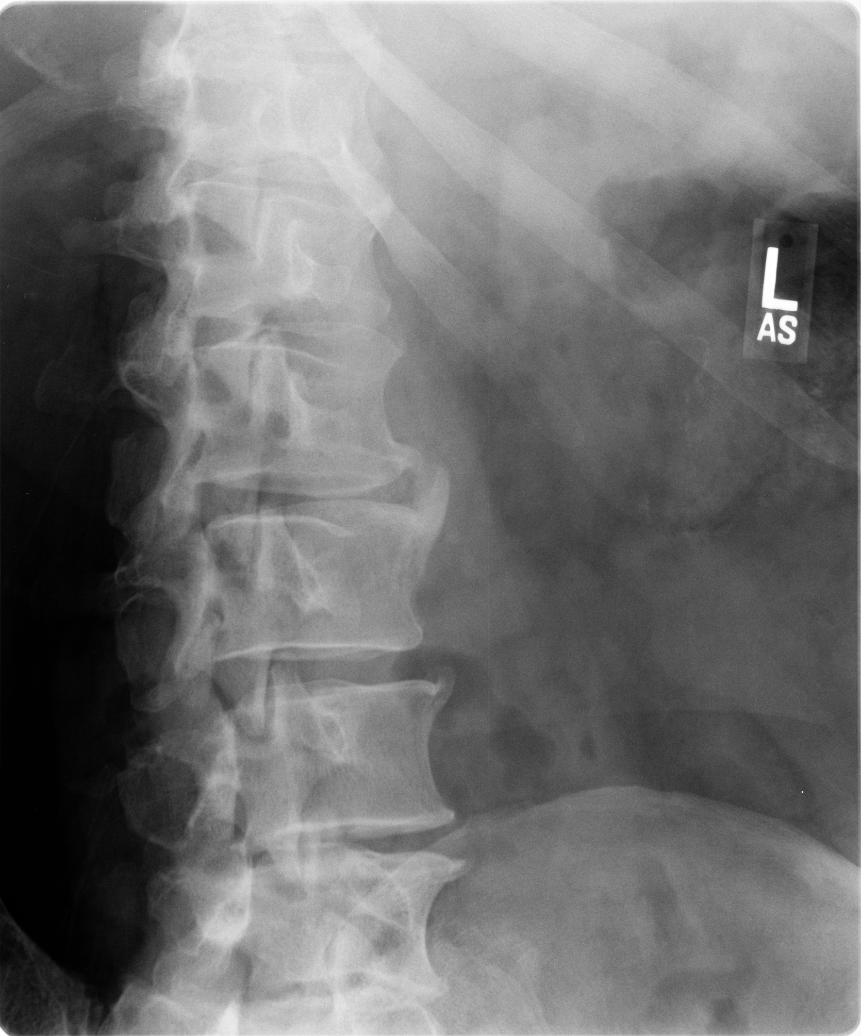

[view not recorded (4 of 5)]
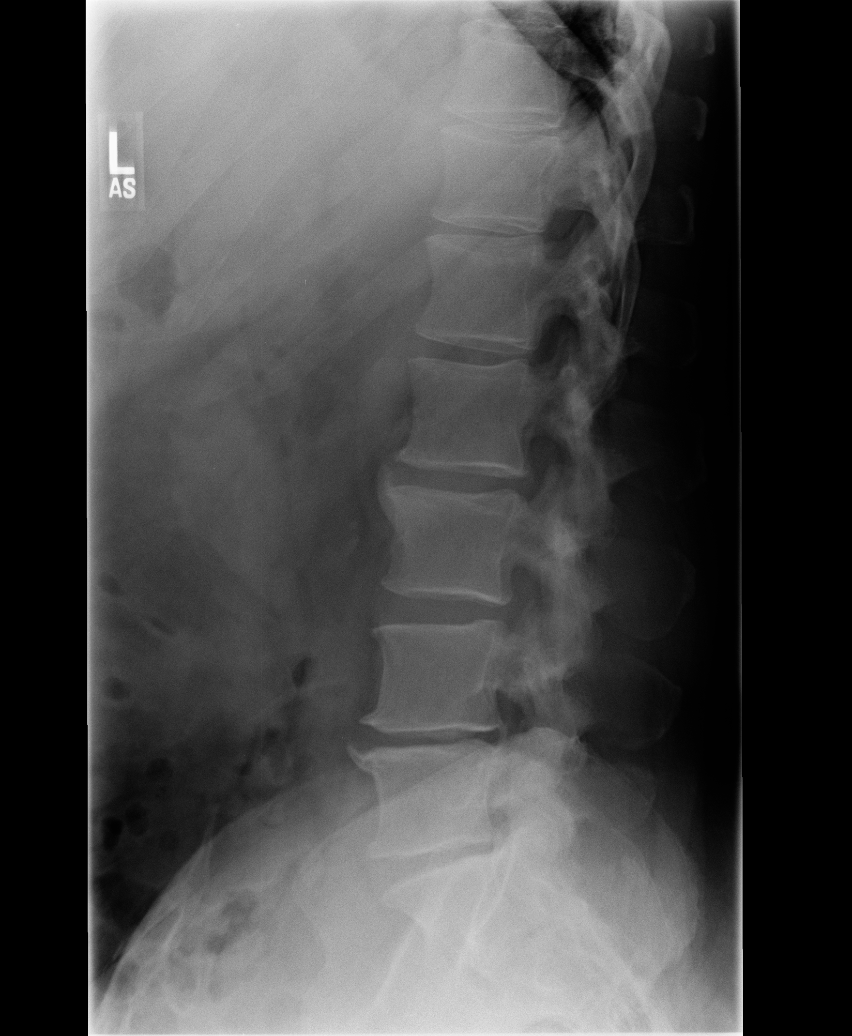

[view not recorded (5 of 5)]
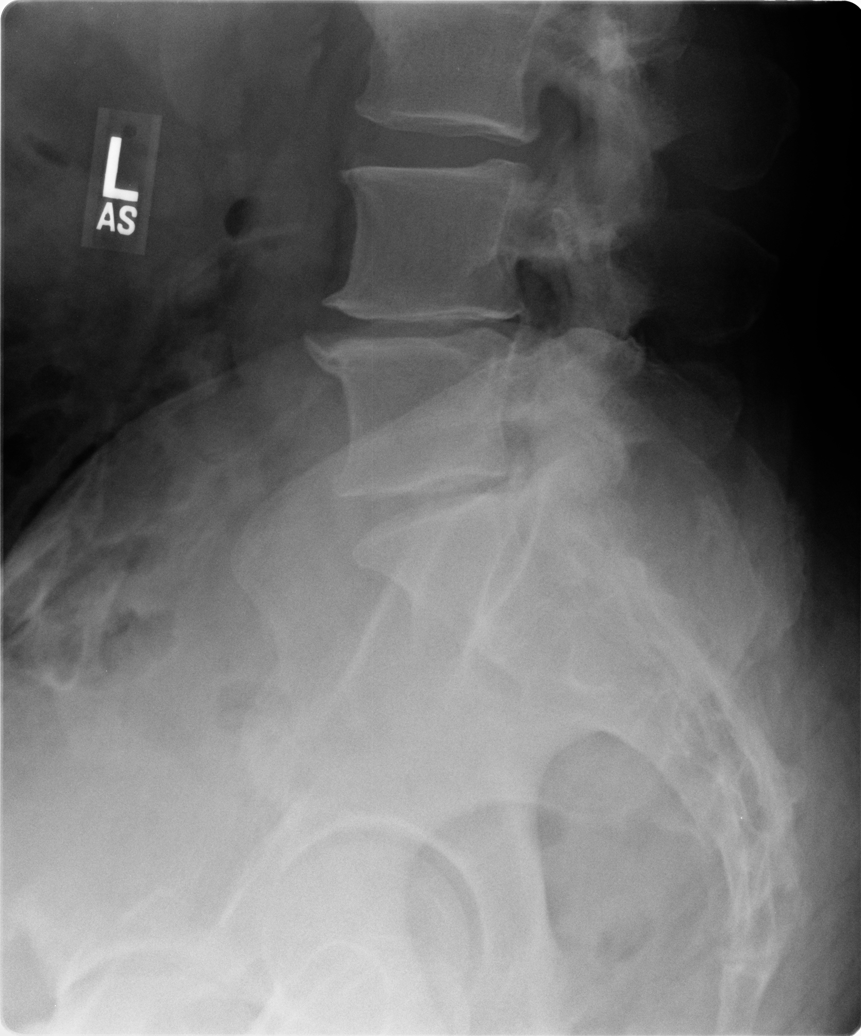

[5 of 5 positions shown; findings below may reference images not displayed]

FINDINGS: Five lumbar type vertebral bodies.

Normal lumbar lordosis.

No evidence of fracture or dislocation. Vertebral body heights are
maintained.

Mild to moderate degenerative changes at L2-3 and L4-5.

Visualized bony pelvis appears intact.
IMPRESSION: No fracture or dislocation is seen.

Mild to moderate degenerative changes at L2-3 and L4-5.

## 2015-06-04 ENCOUNTER — Other Ambulatory Visit: Payer: Self-pay | Admitting: Physician Assistant

## 2015-06-06 ENCOUNTER — Encounter: Payer: Self-pay | Admitting: Family Medicine

## 2015-06-06 NOTE — Telephone Encounter (Signed)
Medication refill for one time only.  Patient needs to be seen.  Letter sent for patient to call and schedule 

## 2015-07-11 ENCOUNTER — Other Ambulatory Visit: Payer: Self-pay | Admitting: Physician Assistant

## 2015-07-12 ENCOUNTER — Encounter: Payer: Self-pay | Admitting: Family Medicine

## 2015-07-12 NOTE — Telephone Encounter (Signed)
Medication refill for one time only.  Patient needs to be seen.  Letter sent for patient to call and schedule 

## 2015-09-14 ENCOUNTER — Encounter: Payer: Self-pay | Admitting: Physician Assistant

## 2015-09-14 ENCOUNTER — Other Ambulatory Visit: Payer: Self-pay | Admitting: Physician Assistant

## 2015-09-14 ENCOUNTER — Ambulatory Visit (INDEPENDENT_AMBULATORY_CARE_PROVIDER_SITE_OTHER): Admitting: Physician Assistant

## 2015-09-14 VITALS — BP 142/78 | HR 90 | Temp 98.7°F | Resp 16 | Ht 72.0 in | Wt 234.0 lb

## 2015-09-14 DIAGNOSIS — I10 Essential (primary) hypertension: Secondary | ICD-10-CM

## 2015-09-14 DIAGNOSIS — E039 Hypothyroidism, unspecified: Secondary | ICD-10-CM | POA: Diagnosis not present

## 2015-09-14 DIAGNOSIS — G4700 Insomnia, unspecified: Secondary | ICD-10-CM | POA: Diagnosis not present

## 2015-09-14 DIAGNOSIS — E785 Hyperlipidemia, unspecified: Secondary | ICD-10-CM | POA: Diagnosis not present

## 2015-09-14 DIAGNOSIS — M109 Gout, unspecified: Secondary | ICD-10-CM

## 2015-09-14 DIAGNOSIS — R739 Hyperglycemia, unspecified: Secondary | ICD-10-CM

## 2015-09-14 DIAGNOSIS — Z658 Other specified problems related to psychosocial circumstances: Secondary | ICD-10-CM

## 2015-09-14 DIAGNOSIS — F439 Reaction to severe stress, unspecified: Secondary | ICD-10-CM

## 2015-09-14 MED ORDER — SIMVASTATIN 40 MG PO TABS: ORAL_TABLET | ORAL | 5 refills | Status: DC

## 2015-09-14 MED ORDER — LEVOTHYROXINE SODIUM 200 MCG PO TABS: ORAL_TABLET | ORAL | 5 refills | Status: DC

## 2015-09-14 MED ORDER — FENOFIBRATE 145 MG PO TABS: 145.0000 mg | ORAL_TABLET | Freq: Every day | ORAL | 5 refills | Status: DC

## 2015-09-14 MED ORDER — ALPRAZOLAM 0.5 MG PO TABS: 0.5000 mg | ORAL_TABLET | Freq: Every evening | ORAL | 1 refills | Status: DC | PRN

## 2015-09-14 MED ORDER — ALLOPURINOL 100 MG PO TABS: 200.0000 mg | ORAL_TABLET | Freq: Every day | ORAL | 5 refills | Status: DC

## 2015-09-14 MED ORDER — COLCHICINE 0.6 MG PO TABS: 0.6000 mg | ORAL_TABLET | Freq: Two times a day (BID) | ORAL | 1 refills | Status: AC

## 2015-09-14 MED ORDER — LOSARTAN POTASSIUM-HCTZ 100-12.5 MG PO TABS: 1.0000 | ORAL_TABLET | Freq: Every day | ORAL | 5 refills | Status: DC
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
## 2015-09-14 NOTE — Addendum Note (Signed)
Addended by: Allayne ButcherIXON, MARY on: 09/14/2015 05:08 PM   Modules accepted: Orders

## 2015-09-15 MED ORDER — SIMVASTATIN 40 MG PO TABS: ORAL_TABLET | ORAL | 5 refills | Status: DC

## 2015-09-15 MED ORDER — LEVOTHYROXINE SODIUM 200 MCG PO TABS: ORAL_TABLET | ORAL | 5 refills | Status: DC

## 2015-09-15 MED ORDER — ALLOPURINOL 100 MG PO TABS: 200.0000 mg | ORAL_TABLET | Freq: Every day | ORAL | 5 refills | Status: DC

## 2015-09-15 MED ORDER — LOSARTAN POTASSIUM-HCTZ 100-12.5 MG PO TABS: 1.0000 | ORAL_TABLET | Freq: Every day | ORAL | 5 refills | Status: DC

## 2015-09-21 ENCOUNTER — Telehealth: Payer: Self-pay | Admitting: Physician Assistant

## 2015-09-21 NOTE — Telephone Encounter (Signed)
Patient would like to talk to you regarding a note and being on xanax for his employer would like a call back at  [phone]

## 2015-09-27 ENCOUNTER — Ambulatory Visit (INDEPENDENT_AMBULATORY_CARE_PROVIDER_SITE_OTHER): Admitting: Family Medicine

## 2015-09-27 ENCOUNTER — Encounter: Payer: Self-pay | Admitting: Family Medicine

## 2015-09-27 ENCOUNTER — Encounter: Payer: Self-pay | Admitting: Physician Assistant

## 2015-09-27 VITALS — BP 132/72 | HR 84 | Temp 98.6°F | Resp 14 | Ht 72.0 in | Wt 231.0 lb

## 2015-09-27 DIAGNOSIS — G4700 Insomnia, unspecified: Secondary | ICD-10-CM

## 2015-09-27 NOTE — Progress Notes (Signed)
Is a unique situation. Patient was recently prescribed Xanax by Frazier RichardsMary Beth Dixon, PA.  He is been doing with a tremendous amount of stress. His son is having legal issues and behavioral issues. This is causing anxiety and insomnia. She prescribed a medication to help him sleep on an as-needed basis. His employer is now requiring a letter stating that he needs the medication. Without Xanax, the patient is unable to sleep. 3-4 days a week he is taking the medication. I drafted a letter explaining the situation for his employer. On certain nights, the patient does need the Xanax to allow him to sleep. Without taking the medication he has intractable insomnia. Patient will not be charged an office visit today. However I did recommend he follow-up with his regular provider Frazier RichardsMary Beth Dixon to discuss medication for depression. I believe is important he continue follow-up with his regular PCP to ensure continuity of care

## 2015-09-28 ENCOUNTER — Encounter: Payer: Self-pay | Admitting: Physician Assistant

## 2015-09-28 ENCOUNTER — Ambulatory Visit (INDEPENDENT_AMBULATORY_CARE_PROVIDER_SITE_OTHER): Admitting: Physician Assistant

## 2015-09-28 ENCOUNTER — Encounter: Payer: Self-pay | Admitting: Family Medicine

## 2015-09-28 VITALS — BP 130/80 | HR 72 | Temp 98.4°F | Resp 16 | Wt 230.0 lb

## 2015-09-28 DIAGNOSIS — F329 Major depressive disorder, single episode, unspecified: Secondary | ICD-10-CM | POA: Insufficient documentation

## 2015-09-28 DIAGNOSIS — F32A Depression, unspecified: Secondary | ICD-10-CM

## 2015-09-28 DIAGNOSIS — G4700 Insomnia, unspecified: Secondary | ICD-10-CM

## 2015-09-28 MED ORDER — SERTRALINE HCL 50 MG PO TABS: 50.0000 mg | ORAL_TABLET | Freq: Every day | ORAL | 1 refills | Status: DC
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
## 2015-09-30 NOTE — Telephone Encounter (Signed)
Pt was given letter at last office visit

## 2015-10-17 ENCOUNTER — Ambulatory Visit: Admitting: Physician Assistant

## 2015-11-08 ENCOUNTER — Other Ambulatory Visit: Payer: Self-pay | Admitting: Physician Assistant

## 2015-11-08 DIAGNOSIS — G4700 Insomnia, unspecified: Secondary | ICD-10-CM

## 2015-11-08 DIAGNOSIS — F439 Reaction to severe stress, unspecified: Secondary | ICD-10-CM

## 2015-11-09 ENCOUNTER — Ambulatory Visit: Admitting: Physician Assistant

## 2015-11-09 NOTE — Telephone Encounter (Signed)
last OV 8-30 Last refill 8-16 Okay to refill?

## 2015-11-09 NOTE — Telephone Encounter (Signed)
09/28/2015--started Zoloft---was to have f/u OV 6 weeks---which would be due mid--October.  He has no OV scheduled.  Have him schedule f/u OV.

## 2015-11-10 NOTE — Telephone Encounter (Signed)
Called pt to sch OV no VM option avail

## 2015-11-23 ENCOUNTER — Other Ambulatory Visit: Payer: Self-pay | Admitting: Physician Assistant

## 2015-11-23 DIAGNOSIS — F32A Depression, unspecified: Secondary | ICD-10-CM

## 2015-11-23 DIAGNOSIS — F329 Major depressive disorder, single episode, unspecified: Secondary | ICD-10-CM

## 2015-11-23 NOTE — Telephone Encounter (Signed)
He has to come in for OV Refill denied.

## 2015-11-23 NOTE — Telephone Encounter (Signed)
Pt canceled his appt on 10-11 and is asking for a refill  I prev gave 30 day when he sch his appt Ok to refill?  Tried calling pt no answer

## 2016-02-17 ENCOUNTER — Other Ambulatory Visit: Payer: Self-pay | Admitting: Physician Assistant

## 2016-02-17 NOTE — Telephone Encounter (Signed)
Rx filled per protocol. Letter sent via mychart reminding pt to sch f/u Ov

## 2016-02-20 ENCOUNTER — Other Ambulatory Visit: Payer: Self-pay | Admitting: Physician Assistant

## 2016-02-21 ENCOUNTER — Other Ambulatory Visit: Payer: Self-pay | Admitting: Physician Assistant

## 2016-02-21 NOTE — Telephone Encounter (Signed)
Rx filled per protocol pt need office visit letter sent via my chart and mailed

## 2016-03-21 ENCOUNTER — Other Ambulatory Visit: Payer: Self-pay | Admitting: Family Medicine

## 2016-03-26 ENCOUNTER — Other Ambulatory Visit: Payer: Self-pay | Admitting: Physician Assistant

## 2016-03-26 NOTE — Telephone Encounter (Signed)
Rx filled per protocol  

## 2016-04-19 ENCOUNTER — Other Ambulatory Visit: Payer: Self-pay | Admitting: Family Medicine

## 2016-04-24 ENCOUNTER — Other Ambulatory Visit: Payer: Self-pay | Admitting: Physician Assistant

## 2016-04-25 NOTE — Telephone Encounter (Signed)
Refill appropriate 

## 2016-05-07 ENCOUNTER — Other Ambulatory Visit: Payer: Self-pay | Admitting: Physician Assistant

## 2016-05-07 NOTE — Telephone Encounter (Signed)
Medication refilled per protocol. Patient is due for an office visit letter sent via my chart for patient to make an appointment

## 2016-06-04 ENCOUNTER — Other Ambulatory Visit: Payer: Self-pay | Admitting: Physician Assistant

## 2016-06-05 NOTE — Telephone Encounter (Signed)
Refill appropriate 

## 2016-06-14 ENCOUNTER — Ambulatory Visit (INDEPENDENT_AMBULATORY_CARE_PROVIDER_SITE_OTHER): Admitting: Physician Assistant

## 2016-06-14 ENCOUNTER — Telehealth: Payer: Self-pay

## 2016-06-14 ENCOUNTER — Encounter: Payer: Self-pay | Admitting: Physician Assistant

## 2016-06-14 VITALS — BP 128/84 | HR 78 | Temp 97.7°F | Resp 16 | Wt 242.8 lb

## 2016-06-14 DIAGNOSIS — G4700 Insomnia, unspecified: Secondary | ICD-10-CM

## 2016-06-14 DIAGNOSIS — Z125 Encounter for screening for malignant neoplasm of prostate: Secondary | ICD-10-CM

## 2016-06-14 DIAGNOSIS — M1000 Idiopathic gout, unspecified site: Secondary | ICD-10-CM | POA: Diagnosis not present

## 2016-06-14 DIAGNOSIS — I10 Essential (primary) hypertension: Secondary | ICD-10-CM

## 2016-06-14 DIAGNOSIS — L239 Allergic contact dermatitis, unspecified cause: Secondary | ICD-10-CM | POA: Diagnosis not present

## 2016-06-14 DIAGNOSIS — E039 Hypothyroidism, unspecified: Secondary | ICD-10-CM

## 2016-06-14 DIAGNOSIS — E785 Hyperlipidemia, unspecified: Secondary | ICD-10-CM | POA: Diagnosis not present

## 2016-06-14 LAB — COMPLETE METABOLIC PANEL WITH GFR
ALT: 42 U/L (ref 9–46)
AST: 30 U/L (ref 10–35)
Albumin: 4.6 g/dL (ref 3.6–5.1)
Alkaline Phosphatase: 37 U/L — ABNORMAL LOW (ref 40–115)
BILIRUBIN TOTAL: 0.7 mg/dL (ref 0.2–1.2)
BUN: 12 mg/dL (ref 7–25)
CO2: 19 mmol/L — AB (ref 20–31)
Calcium: 9.7 mg/dL (ref 8.6–10.3)
Chloride: 106 mmol/L (ref 98–110)
Creat: 1.26 mg/dL (ref 0.70–1.33)
GFR, EST NON AFRICAN AMERICAN: 64 mL/min (ref >=60)
GFR, Est African American: 74 mL/min (ref >=60)
GLUCOSE: 121 mg/dL — AB (ref 70–99)
POTASSIUM: 4.7 mmol/L (ref 3.5–5.3)
Sodium: 142 mmol/L (ref 135–146)
Total Protein: 6.6 g/dL (ref 6.1–8.1)

## 2016-06-14 LAB — LIPID PANEL
CHOLESTEROL: 144 mg/dL (ref <200)
HDL: 31 mg/dL — AB (ref >40)
LDL CALC: 70 mg/dL (ref <100)
TRIGLYCERIDES: 217 mg/dL — AB (ref <150)
Total CHOL/HDL Ratio: 4.6 Ratio (ref <5.0)
VLDL: 43 mg/dL — ABNORMAL HIGH (ref <30)

## 2016-06-14 LAB — TSH: TSH: 1.18 mIU/L (ref 0.40–4.50)

## 2016-06-14 MED ORDER — HYDROXYZINE HCL 25 MG PO TABS: 25.0000 mg | ORAL_TABLET | Freq: Three times a day (TID) | ORAL | 1 refills | Status: DC | PRN

## 2016-06-14 MED ORDER — ZOLPIDEM TARTRATE 10 MG PO TABS: 10.0000 mg | ORAL_TABLET | Freq: Every day | ORAL | 2 refills | Status: DC

## 2016-06-14 NOTE — Telephone Encounter (Signed)
Rx sent to pharmacy. Pt aware.  
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
## 2016-06-14 NOTE — Telephone Encounter (Signed)
Patient was seen in office today and states you all talked about prescribing him something to help him sleep and is requesting a prescription.

## 2016-06-14 NOTE — Telephone Encounter (Signed)
Yes-- I realized when I was doing his note after he left---can you call in Rx for Ambien 10mg  1 po QHS # 30 +2[place]

## 2016-06-15 LAB — PSA: PSA: 0.6 ng/mL (ref <=4.0)

## 2016-06-15 LAB — URIC ACID: Uric Acid, Serum: 6.4 mg/dL (ref 4.0–8.0)

## 2016-07-29 ENCOUNTER — Other Ambulatory Visit: Payer: Self-pay | Admitting: Physician Assistant

## 2016-07-30 NOTE — Telephone Encounter (Signed)
Refill appropriate 

## 2016-09-14 ENCOUNTER — Other Ambulatory Visit: Payer: Self-pay | Admitting: Physician Assistant

## 2016-09-14 NOTE — Telephone Encounter (Signed)
Refill appropriate 

## 2016-09-17 ENCOUNTER — Encounter: Payer: Self-pay | Admitting: Physician Assistant

## 2016-09-23 ENCOUNTER — Other Ambulatory Visit: Payer: Self-pay | Admitting: Family Medicine

## 2016-09-24 ENCOUNTER — Other Ambulatory Visit: Payer: Self-pay | Admitting: Physician Assistant

## 2016-09-24 NOTE — Telephone Encounter (Signed)
Refill appropriate 

## 2017-01-06 ENCOUNTER — Other Ambulatory Visit: Payer: Self-pay | Admitting: Physician Assistant

## 2017-03-18 ENCOUNTER — Other Ambulatory Visit: Payer: Self-pay | Admitting: Physician Assistant

## 2017-03-18 DIAGNOSIS — L239 Allergic contact dermatitis, unspecified cause: Secondary | ICD-10-CM

## 2017-04-07 ENCOUNTER — Other Ambulatory Visit: Payer: Self-pay | Admitting: Physician Assistant

## 2017-05-16 ENCOUNTER — Other Ambulatory Visit: Payer: Self-pay | Admitting: Physician Assistant

## 2017-05-21 ENCOUNTER — Other Ambulatory Visit: Payer: Self-pay | Admitting: Physician Assistant

## 2017-05-22 NOTE — Telephone Encounter (Signed)
Last OV 06/14/2016 Last refill 06/14/2016 Ok to refill? Ambien

## 2017-06-27 ENCOUNTER — Other Ambulatory Visit: Payer: Self-pay | Admitting: Physician Assistant

## 2017-08-04 ENCOUNTER — Other Ambulatory Visit: Payer: Self-pay | Admitting: Physician Assistant

## 2017-08-29 ENCOUNTER — Other Ambulatory Visit: Payer: Self-pay | Admitting: Physician Assistant

## 2017-08-29 NOTE — Telephone Encounter (Signed)
Patient dur for an office visit letter mailed for patient to call and schedule an appointment.

## 2017-09-04 ENCOUNTER — Other Ambulatory Visit: Payer: Self-pay | Admitting: Physician Assistant

## 2017-09-30 ENCOUNTER — Other Ambulatory Visit: Payer: Self-pay | Admitting: Physician Assistant

## 2017-10-07 ENCOUNTER — Other Ambulatory Visit: Payer: Self-pay | Admitting: Physician Assistant

## 2017-10-31 ENCOUNTER — Other Ambulatory Visit: Payer: Self-pay | Admitting: Physician Assistant

## 2017-11-06 ENCOUNTER — Other Ambulatory Visit: Payer: Self-pay | Admitting: Physician Assistant

## 2017-11-30 ENCOUNTER — Other Ambulatory Visit: Payer: Self-pay | Admitting: Physician Assistant

## 2017-12-08 ENCOUNTER — Other Ambulatory Visit: Payer: Self-pay | Admitting: Physician Assistant

## 2017-12-10 ENCOUNTER — Other Ambulatory Visit: Payer: Self-pay | Admitting: Family Medicine

## 2018-01-07 ENCOUNTER — Other Ambulatory Visit: Payer: Self-pay | Admitting: Family Medicine

## 2018-03-07 ENCOUNTER — Other Ambulatory Visit: Payer: Self-pay | Admitting: Family Medicine

## 2018-03-28 ENCOUNTER — Other Ambulatory Visit: Payer: Self-pay | Admitting: *Deleted

## 2018-03-28 MED ORDER — ALLOPURINOL 100 MG PO TABS: 200.0000 mg | ORAL_TABLET | Freq: Every day | ORAL | 0 refills | Status: DC

## 2018-04-20 ENCOUNTER — Other Ambulatory Visit: Payer: Self-pay | Admitting: Family Medicine

## 2018-04-21 ENCOUNTER — Other Ambulatory Visit: Payer: Self-pay | Admitting: Family Medicine

## 2018-05-22 ENCOUNTER — Other Ambulatory Visit: Payer: Self-pay | Admitting: Family Medicine
